# Patient Record
Sex: Female | Born: 1985 | Race: White | Hispanic: No | Marital: Single | Smoking: Never smoker
Health system: Southern US, Community
[De-identification: ages and names within clinical notes are randomized; demographics above are authoritative.]

## PROBLEM LIST (undated history)

## (undated) DIAGNOSIS — E139 Other specified diabetes mellitus without complications: Secondary | ICD-10-CM

## (undated) DIAGNOSIS — J302 Other seasonal allergic rhinitis: Secondary | ICD-10-CM

## (undated) DIAGNOSIS — J45909 Unspecified asthma, uncomplicated: Secondary | ICD-10-CM

## (undated) HISTORY — DX: Other seasonal allergic rhinitis: J30.2

## (undated) HISTORY — DX: Unspecified asthma, uncomplicated: J45.909

## (undated) HISTORY — DX: Other specified diabetes mellitus without complications: E13.9

---
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
## 2004-10-27 ENCOUNTER — Ambulatory Visit (HOSPITAL_COMMUNITY): Admission: RE | Admit: 2004-10-27 | Discharge: 2004-10-27 | Payer: Self-pay | Admitting: Allergy and Immunology

## 2008-01-01 ENCOUNTER — Other Ambulatory Visit: Admission: RE | Admit: 2008-01-01 | Discharge: 2008-01-01 | Payer: Self-pay | Admitting: Obstetrics and Gynecology

## 2008-07-31 ENCOUNTER — Ambulatory Visit: Payer: Self-pay | Admitting: Women's Health

## 2016-11-14 ENCOUNTER — Encounter: Payer: Self-pay | Admitting: Women's Health

## 2016-11-14 ENCOUNTER — Ambulatory Visit (INDEPENDENT_AMBULATORY_CARE_PROVIDER_SITE_OTHER): Payer: Managed Care, Other (non HMO) | Admitting: Women's Health

## 2016-11-14 VITALS — BP 110/78 | Ht 62.0 in | Wt 131.0 lb

## 2016-11-14 DIAGNOSIS — Z01419 Encounter for gynecological examination (general) (routine) without abnormal findings: Secondary | ICD-10-CM | POA: Diagnosis not present

## 2016-11-14 LAB — COMPREHENSIVE METABOLIC PANEL
ALT: 27 U/L (ref 6–29)
AST: 22 U/L (ref 10–30)
Albumin: 3.7 g/dL (ref 3.6–5.1)
Alkaline Phosphatase: 53 U/L (ref 33–115)
BUN: 13 mg/dL (ref 7–25)
CO2: 24 mmol/L (ref 20–31)
Calcium: 9.7 mg/dL (ref 8.6–10.2)
Chloride: 103 mmol/L (ref 98–110)
Creat: 0.7 mg/dL (ref 0.50–1.10)
Glucose, Bld: 221 mg/dL — ABNORMAL HIGH (ref 65–99)
Potassium: 4.5 mmol/L (ref 3.5–5.3)
Sodium: 137 mmol/L (ref 135–146)
Total Bilirubin: 0.6 mg/dL (ref 0.2–1.2)
Total Protein: 6.5 g/dL (ref 6.1–8.1)

## 2016-11-14 LAB — CBC WITH DIFFERENTIAL/PLATELET
Basophils Absolute: 56 cells/uL (ref 0–200)
Basophils Relative: 1 %
Eosinophils Absolute: 392 cells/uL (ref 15–500)
Eosinophils Relative: 7 %
HCT: 39.5 % (ref 35.0–45.0)
Hemoglobin: 12.8 g/dL (ref 11.7–15.5)
Lymphocytes Relative: 36 %
Lymphs Abs: 2016 cells/uL (ref 850–3900)
MCH: 31.1 pg (ref 27.0–33.0)
MCHC: 32.4 g/dL (ref 32.0–36.0)
MCV: 96.1 fL (ref 80.0–100.0)
MPV: 8.5 fL (ref 7.5–12.5)
Monocytes Absolute: 336 cells/uL (ref 200–950)
Monocytes Relative: 6 %
Neutro Abs: 2800 cells/uL (ref 1500–7800)
Neutrophils Relative %: 50 %
Platelets: 318 10*3/uL (ref 140–400)
RBC: 4.11 MIL/uL (ref 3.80–5.10)
RDW: 12.4 % (ref 11.0–15.0)
WBC: 5.6 10*3/uL (ref 3.8–10.8)

## 2016-11-14 NOTE — Patient Instructions (Signed)

## 2016-11-14 NOTE — Progress Notes (Signed)
  Robby Sermonlejandra O Gordon November 17, 1985 [id number]    History:    Presents for postpartum exam. Currently staying with her mother for her two-month maternity leave. [place] who is also on paternity leave for 2 months. Questions when she can return to exercise, when is a safe time to conceive, and has questions about C-section incision. Having minimal discomfort, no fever, g minimal bleeding. Had use condoms prior to conception. Type 1 diabetes since age 31, reports excellent control during pregnancy were hemoglobin A1c's were in the fives. Breast-feeding. Had mastitis which has now resolved after antibiotic. Reports normal Pap history.  Past medical history, past surgical history, family history and social history were all reviewed and documented in the EPIC chart.  ROS:  A ROS was performed and pertinent positives and negatives are included.  Exam:  Vitals:   11/14/16 0914  BP: 110/78  Weight: 131 lb (59.4 kg)  Height: 5\' 2"  (1.575 m)   Body mass index is 23.96 kg/m.   General appearance:  Normal Thyroid:  Symmetrical, normal in size, without palpable masses or nodularity. Respiratory  Auscultation:  Clear without wheezing or rhonchi Cardiovascular  Auscultation:  Regular rate, without rubs, murmurs or gallops  Edema/varicosities:  Not grossly evident Abdominal  Soft,nontender, without masses, guarding or rebound.  Liver/spleen:  No organomegaly noted  Hernia:  None appreciated  Skin  Inspection:  Grossly normal   Breasts: Examined lying and sitting.     Right: Without masses, retractions, discharge or axillary adenopathy.     Left: Without masses, retractions, discharge or axillary adenopathy. Gentitourinary   Inguinal/mons:  Normal without inguinal adenopathy  External genitalia:  Normal  BUS/Urethra/Skene's glands:  Normal  Vagina:  Normal  Cervix:  Normal  Uterus:   normal in size, shape and contour.  Midline and mobile  Adnexa/parametria:      Rt: Without masses or tenderness.   Lt: Without masses or tenderness.  Anus and perineum: Normal  Digital rectal exam: Normal sphincter tone without palpated masses or tenderness  Assessment/Plan:  31 y.o. MH G1 P1 for postpartum exam.  Postpartum 4 weeks/breast-feeding Status post C-section Type 1 diabetes  Plan: Continue vitamins daily. Increase exercise gradually, reviewed importance of low impact exercise initially. Reviewed importance of rotating the baby's position with breast-feeding, frequent feedings. Vitamin E oil to incisional line which is well-healed. Contraception options reviewed will continue condoms, reviewed may have slow return to cycle since breast-feeding. Questions answered. Hemoglobin A1c, CMP and CBC.      Harrington Challengerancy J Adylee Leonardo [facility] HospitalWHNP, 1:26 PM 11/14/2016

## 2016-11-15 LAB — HEMOGLOBIN A1C
Hgb A1c MFr Bld: 6.1 % — ABNORMAL HIGH (ref <5.7)
Mean Plasma Glucose: 128 mg/dL

## 2019-04-04 LAB — HM PAP SMEAR

## 2020-01-14 ENCOUNTER — Ambulatory Visit: Payer: Self-pay

## 2020-05-24 ENCOUNTER — Other Ambulatory Visit: Payer: Self-pay | Admitting: Oncology

## 2020-05-24 DIAGNOSIS — U071 COVID-19: Secondary | ICD-10-CM

## 2020-05-25 ENCOUNTER — Ambulatory Visit (HOSPITAL_COMMUNITY)
Admission: RE | Admit: 2020-05-25 | Discharge: 2020-05-25 | Disposition: A | Discharge status: Home / Self Care | Payer: 59 | Source: Ambulatory Visit | Attending: Pulmonary Disease | Admitting: Pulmonary Disease

## 2020-05-25 DIAGNOSIS — U071 COVID-19: Secondary | ICD-10-CM | POA: Insufficient documentation

## 2020-05-25 MED ORDER — EPINEPHRINE 0.3 MG/0.3ML IJ SOAJ: 0.3000 mg | Freq: Once | INTRAMUSCULAR | Status: DC | PRN

## 2020-05-25 MED ORDER — SODIUM CHLORIDE 0.9 % IV SOLN: Freq: Once | INTRAVENOUS | Status: AC

## 2020-05-25 MED ORDER — SODIUM CHLORIDE 0.9 % IV SOLN: INTRAVENOUS | Status: DC | PRN

## 2020-05-25 MED ORDER — METHYLPREDNISOLONE SODIUM SUCC 125 MG IJ SOLR: 125.0000 mg | Freq: Once | INTRAMUSCULAR | Status: DC | PRN

## 2020-05-25 MED ORDER — DIPHENHYDRAMINE HCL 50 MG/ML IJ SOLN: 50.0000 mg | Freq: Once | INTRAMUSCULAR | Status: DC | PRN

## 2020-05-25 MED ORDER — ALBUTEROL SULFATE HFA 108 (90 BASE) MCG/ACT IN AERS: 2.0000 | INHALATION_SPRAY | Freq: Once | RESPIRATORY_TRACT | Status: DC | PRN

## 2020-05-25 MED ORDER — FAMOTIDINE IN NACL 20-0.9 MG/50ML-% IV SOLN: 20.0000 mg | Freq: Once | INTRAVENOUS | Status: DC | PRN

## 2020-05-25 NOTE — Discharge Instructions (Signed)
If you have any questions or concerns after the infusion please call the Advanced Practice Provider on call at [phone]. This number is ONLY intended for your use regarding questions or concerns about the infusion post-treatment side-effects.  Please do not provide this number to others for use. For return to work notes please contact your primary care provider.  ° °If someone you know is interested in receiving treatment please have them call the COVID hotline at [phone]. ° ° ° ° ° ° °What types of side effects do monoclonal antibody drugs cause?  °Common side effects ° °In general, the more common side effects caused by monoclonal antibody drugs include: °• Allergic reactions, such as hives or itching °• Flu-like signs and symptoms, including chills, fatigue, fever, and muscle aches and pains °• Nausea, vomiting °• Diarrhea °• Skin rashes °• Low blood pressure ° ° °The CDC is recommending patients who receive monoclonal antibody treatments wait at least 90 days before being vaccinated. ° °Currently, there are no data on the safety and efficacy of mRNA COVID-19 vaccines in persons who received monoclonal antibodies or convalescent plasma as part of COVID-19 treatment. Based on the estimated half-life of such therapies as well as evidence suggesting that reinfection is uncommon in the 90 days after initial infection, vaccination should be deferred for at least 90 days, as a precautionary measure until additional information becomes available, to avoid interference of the antibody treatment with vaccine-induced immune responses. ° ° ° ° ° ° °10 Things You Can Do to Manage Your COVID-19 Symptoms at Home °If you have possible or confirmed COVID-19: °1. Stay home from work and school. And stay away from other public places. If you must go out, avoid using any kind of public transportation, ridesharing, or taxis. °2. Monitor your symptoms carefully. If your symptoms get worse, call your healthcare provider  immediately. °3. Get rest and stay hydrated. °4. If you have a medical appointment, call the healthcare provider ahead of time and tell them that you have or may have COVID-19. °5. For medical emergencies, call 911 and notify the dispatch personnel that you have or may have COVID-19. °6. Cover your cough and sneezes with a tissue or use the inside of your elbow. °7. Wash your hands often with soap and water for at least 20 seconds or clean your hands with an alcohol-based hand sanitizer that contains at least 60% alcohol. °8. As much as possible, stay in a specific room and away from other people in your home. Also, you should use a separate bathroom, if available. If you need to be around other people in or outside of the home, wear a mask. °9. Avoid sharing personal items with other people in your household, like dishes, towels, and bedding. °10. Clean all surfaces that are touched often, like counters, tabletops, and doorknobs. Use household cleaning sprays or wipes according to the label instructions. °cdc.gov/coronavirus °12/04/2018 °This information is not intended to replace advice given to you by your health care provider. Make sure you discuss any questions you have with your health care provider. °Document Revised: 05/08/2019 Document Reviewed: 05/08/2019 °Elsevier Patient Education © 2020 Elsevier Inc. ° °

## 2020-05-25 NOTE — Progress Notes (Signed)
  Diagnosis: COVID-19  Physician: Dr. Wright  Procedure: [facility] Med: bamlanivimab\etesevimab infusion - Provided patient with bamlanimivab\etesevimab fact sheet for patients, parents and caregivers prior to infusion.  Complications: No immediate complications noted.  Discharge: Discharged home   Taino Maertens R Donnabelle Blanchard 05/25/2020    

## 2020-08-05 ENCOUNTER — Other Ambulatory Visit: Payer: Self-pay

## 2020-08-05 ENCOUNTER — Encounter: Payer: Self-pay | Admitting: Plastic Surgery

## 2020-08-05 ENCOUNTER — Ambulatory Visit (INDEPENDENT_AMBULATORY_CARE_PROVIDER_SITE_OTHER): Payer: Self-pay | Admitting: Plastic Surgery

## 2020-08-05 VITALS — BP 115/75 | HR 91

## 2020-08-05 DIAGNOSIS — Z719 Counseling, unspecified: Secondary | ICD-10-CM

## 2020-08-05 DIAGNOSIS — Z411 Encounter for cosmetic surgery: Secondary | ICD-10-CM

## 2020-08-05 NOTE — Progress Notes (Signed)
Patient presents to discuss Botox injection.  She has had Botox before mostly in the forehead and glabellar area.  She is interested in treating the dynamic and occasionally static lines in her forehead and glabella area.  She has gotten Botox in the past but is uncertain of the amount.  We discussed the risks and benefits of Botox treatment and she is interested in moving forward.  The forehead and glabella were prepped with an alcohol pad.  28 units of Botox were distributed along the forehead and glabella.  She tolerated this well.  I asked her to make a 2-week touchup appointment in the event that it is necessary and otherwise we will see her at her next visit.  All of her questions were answered.

## 2020-08-12 ENCOUNTER — Other Ambulatory Visit: Payer: Self-pay

## 2020-08-12 ENCOUNTER — Encounter: Payer: Self-pay | Admitting: Internal Medicine

## 2020-08-12 ENCOUNTER — Ambulatory Visit (INDEPENDENT_AMBULATORY_CARE_PROVIDER_SITE_OTHER): Payer: 59

## 2020-08-12 ENCOUNTER — Ambulatory Visit: Payer: 59 | Admitting: Internal Medicine

## 2020-08-12 VITALS — BP 104/68 | HR 76 | Temp 98.1°F | Ht 62.0 in | Wt 131.0 lb

## 2020-08-12 DIAGNOSIS — R7303 Prediabetes: Secondary | ICD-10-CM | POA: Insufficient documentation

## 2020-08-12 DIAGNOSIS — E109 Type 1 diabetes mellitus without complications: Secondary | ICD-10-CM | POA: Insufficient documentation

## 2020-08-12 DIAGNOSIS — J22 Unspecified acute lower respiratory infection: Secondary | ICD-10-CM | POA: Diagnosis not present

## 2020-08-12 DIAGNOSIS — R058 Other specified cough: Secondary | ICD-10-CM

## 2020-08-12 DIAGNOSIS — Z0001 Encounter for general adult medical examination with abnormal findings: Secondary | ICD-10-CM | POA: Insufficient documentation

## 2020-08-12 DIAGNOSIS — Z0000 Encounter for general adult medical examination without abnormal findings: Secondary | ICD-10-CM

## 2020-08-12 LAB — LIPID PANEL
Cholesterol: 173 mg/dL (ref 0–200)
HDL: 52.5 mg/dL (ref >39.00)
LDL Cholesterol: 110 mg/dL — ABNORMAL HIGH (ref 0–99)
NonHDL: 120.66
Total CHOL/HDL Ratio: 3
Triglycerides: 55 mg/dL (ref 0.0–149.0)
VLDL: 11 mg/dL (ref 0.0–40.0)

## 2020-08-12 LAB — BASIC METABOLIC PANEL
BUN: 13 mg/dL (ref 6–23)
CO2: 28 mEq/L (ref 19–32)
Calcium: 9.5 mg/dL (ref 8.4–10.5)
Chloride: 103 mEq/L (ref 96–112)
Creatinine, Ser: 0.71 mg/dL (ref 0.40–1.20)
GFR: 109.28 mL/min (ref >60.00)
Glucose, Bld: 187 mg/dL — ABNORMAL HIGH (ref 70–99)
Potassium: 4.2 mEq/L (ref 3.5–5.1)
Sodium: 137 mEq/L (ref 135–145)

## 2020-08-12 LAB — CBC WITH DIFFERENTIAL/PLATELET
Basophils Absolute: 0 10*3/uL (ref 0.0–0.1)
Basophils Relative: 0.7 % (ref 0.0–3.0)
Eosinophils Absolute: 0.3 10*3/uL (ref 0.0–0.7)
Eosinophils Relative: 5.9 % — ABNORMAL HIGH (ref 0.0–5.0)
HCT: 38.3 % (ref 36.0–46.0)
Hemoglobin: 13.1 g/dL (ref 12.0–15.0)
Lymphocytes Relative: 44.9 % (ref 12.0–46.0)
Lymphs Abs: 2 10*3/uL (ref 0.7–4.0)
MCHC: 34.2 g/dL (ref 30.0–36.0)
MCV: 89.5 fl (ref 78.0–100.0)
Monocytes Absolute: 0.3 10*3/uL (ref 0.1–1.0)
Monocytes Relative: 7.5 % (ref 3.0–12.0)
Neutro Abs: 1.8 10*3/uL (ref 1.4–7.7)
Neutrophils Relative %: 41 % — ABNORMAL LOW (ref 43.0–77.0)
Platelets: 323 10*3/uL (ref 150.0–400.0)
RBC: 4.28 Mil/uL (ref 3.87–5.11)
RDW: 12.5 % (ref 11.5–15.5)
WBC: 4.5 10*3/uL (ref 4.0–10.5)

## 2020-08-12 LAB — URINALYSIS, ROUTINE W REFLEX MICROSCOPIC
Bilirubin Urine: NEGATIVE
Ketones, ur: 15 — AB
Leukocytes,Ua: NEGATIVE
Nitrite: NEGATIVE
Specific Gravity, Urine: 1.015 (ref 1.000–1.030)
Total Protein, Urine: NEGATIVE
Urine Glucose: NEGATIVE
Urobilinogen, UA: 0.2 (ref 0.0–1.0)
pH: 6.5 (ref 5.0–8.0)

## 2020-08-12 LAB — HEPATIC FUNCTION PANEL
ALT: 10 U/L (ref 0–35)
AST: 15 U/L (ref 0–37)
Albumin: 4.2 g/dL (ref 3.5–5.2)
Alkaline Phosphatase: 32 U/L — ABNORMAL LOW (ref 39–117)
Bilirubin, Direct: 0.1 mg/dL (ref 0.0–0.3)
Total Bilirubin: 0.7 mg/dL (ref 0.2–1.2)
Total Protein: 7.2 g/dL (ref 6.0–8.3)

## 2020-08-12 LAB — MICROALBUMIN / CREATININE URINE RATIO
Creatinine,U: 53.9 mg/dL
Microalb Creat Ratio: 1.3 mg/g (ref 0.0–30.0)
Microalb, Ur: <0.7 mg/dL (ref 0.0–1.9)

## 2020-08-12 LAB — HEMOGLOBIN A1C: Hgb A1c MFr Bld: 7.1 % — ABNORMAL HIGH (ref 4.6–6.5)

## 2020-08-12 MED ORDER — HYDROCODONE-HOMATROPINE 5-1.5 MG/5ML PO SYRP: 5.0000 mL | ORAL_SOLUTION | Freq: Four times a day (QID) | ORAL | 0 refills | Status: AC | PRN

## 2020-08-12 MED ORDER — CEFDINIR 300 MG PO CAPS: 300.0000 mg | ORAL_CAPSULE | Freq: Two times a day (BID) | ORAL | 0 refills | Status: AC

## 2020-08-12 MED ORDER — HYDROCODONE-HOMATROPINE 5-1.5 MG/5ML PO SYRP: 5.0000 mL | ORAL_SOLUTION | Freq: Four times a day (QID) | ORAL | 0 refills | Status: DC | PRN

## 2020-08-12 NOTE — Patient Instructions (Signed)

## 2020-08-12 NOTE — Progress Notes (Unsigned)
Subjective:  Patient ID: Angela Pierce, female    DOB: 1985/10/10  Age: 35 y.o. MRN: [id number]  CC: Cough and Annual Exam  This visit occurred during the SARS-CoV-2 public health emergency.  Safety protocols were in place, including screening questions prior to the visit, additional usage of staff PPE, and extensive cleaning of exam room while observing appropriate contact time as indicated for disinfecting solutions.    HPI Angela Pierce presents for a CPX and to establish.  She complains of a 3-week history of cough that is occasionally productive of brown phlegm.  She also has shortness of breath but she denies fever, chills, night sweats, or chest pain.  She has a history of exercise-induced asthma and has mild wheezing but is getting adequate symptom relief with albuterol inhaler.  She has type 1 diabetes mellitus and is on an insulin pump.  She moved to [place] from [place] about 2 years ago.  History Kosha has a past medical history of Asthma, Diabetes 1.5, managed as type 1 (HCC), and Seasonal allergies.   She has a past surgical history that includes Cesarean section.   Her family history includes Aortic aneurysm in her mother and paternal grandmother; Heart disease in her father, maternal grandfather, and paternal grandmother.She reports that she has never smoked. She has never used smokeless tobacco. She reports current alcohol use. She reports that she does not use drugs.  No outpatient medications prior to visit.   No facility-administered medications prior to visit.    ROS Review of Systems  Constitutional: Negative for appetite change, diaphoresis, fatigue and unexpected weight change.  HENT: Negative.  Negative for sinus pressure, sore throat and trouble swallowing.   Eyes: Negative.  Negative for visual disturbance.  Respiratory: Positive for cough, shortness of breath and wheezing. Negative for chest tightness and stridor.   Cardiovascular:  Negative for chest pain, palpitations and leg swelling.  Gastrointestinal: Negative for abdominal pain, blood in stool, constipation, diarrhea, nausea and vomiting.  Endocrine: Negative.  Negative for polydipsia, polyphagia and polyuria.  Genitourinary: Negative.  Negative for difficulty urinating.  Musculoskeletal: Negative.  Negative for arthralgias, back pain, myalgias and neck pain.  Skin: Negative.  Negative for color change, pallor and rash.  Neurological: Negative.   Hematological: Negative for adenopathy. Does not bruise/bleed easily.  Psychiatric/Behavioral: Negative.     Objective:  BP 104/68   Pulse 76   Temp 98.1 F (36.7 C) (Oral)   Ht 5\' 2"  (1.575 m)   Wt 131 lb (59.4 kg)   LMP 08/07/2020 (LMP Unknown)   SpO2 98%   BMI 23.96 kg/m   Physical Exam Vitals reviewed.  Constitutional:      Appearance: Normal appearance.  HENT:     Nose: Nose normal.     Mouth/Throat:     Mouth: Mucous membranes are moist.  Eyes:     General: No scleral icterus.    Conjunctiva/sclera: Conjunctivae normal.  Cardiovascular:     Rate and Rhythm: Normal rate and regular rhythm.     Heart sounds: No murmur heard.   Pulmonary:     Effort: Pulmonary effort is normal.     Breath sounds: No stridor. No wheezing, rhonchi or rales.  Abdominal:     General: Abdomen is flat. Bowel sounds are normal. There is no distension.     Palpations: Abdomen is soft. There is no hepatomegaly, splenomegaly or mass.     Tenderness: There is no abdominal tenderness.  Musculoskeletal:  General: Normal range of motion.     Cervical back: Neck supple.     Right lower leg: No edema.     Left lower leg: No edema.  Lymphadenopathy:     Cervical: No cervical adenopathy.  Skin:    General: Skin is warm and dry.     Coloration: Skin is not pale.  Neurological:     General: No focal deficit present.     Mental Status: She is alert.  Psychiatric:        Mood and Affect: Mood normal.         Behavior: Behavior normal.     Lab Results  Component Value Date   WBC 4.5 08/12/2020   HGB 13.1 08/12/2020   HCT 38.3 08/12/2020   PLT 323.0 08/12/2020   GLUCOSE 187 (H) 08/12/2020   CHOL 173 08/12/2020   TRIG 55.0 08/12/2020   HDL 52.50 08/12/2020   LDLCALC 110 (H) 08/12/2020   ALT 10 08/12/2020   AST 15 08/12/2020   NA 137 08/12/2020   K 4.2 08/12/2020   CL 103 08/12/2020   CREATININE 0.71 08/12/2020   BUN 13 08/12/2020   CO2 28 08/12/2020   HGBA1C 7.1 (H) 08/12/2020   MICROALBUR <0.7 08/12/2020   No results found.  Assessment & Plan:   Angela Pierce was seen today for cough and annual exam.  Diagnoses and all orders for this visit:  Encounter for general adult medical examination with abnormal findings- Exam completed, labs reviewed, she tells me her vaccines are up-to-date - She will have records forwarded to me from [place], cancer screenings are up-to-date, patient education was given. -     Lipid panel; Future -     Hepatitis C antibody; Future -     HIV Antibody (routine testing w rflx); Future -     HIV Antibody (routine testing w rflx) -     Hepatitis C antibody -     Lipid panel  Type 1 diabetes mellitus without complication (HCC)- Her blood sugar is adequately well controlled. -     CBC with Differential/Platelet; Future -     Basic metabolic panel; Future -     Urinalysis, Routine w reflex microscopic; Future -     Hemoglobin A1c; Future -     Hepatic function panel; Future -     Microalbumin / creatinine urine ratio; Future -     Ambulatory referral to Ophthalmology -     Microalbumin / creatinine urine ratio -     Hepatic function panel -     Hemoglobin A1c -     Urinalysis, Routine w reflex microscopic -     Basic metabolic panel -     CBC with Differential/Platelet -     HM Diabetes Foot Exam  Cough with sputum- Her chest x-ray is negative for infiltrate.  Will treat for lower respiratory tract infection with a broad-spectrum cephalosporin. -      DG Chest 2 View; Future  LRTI (lower respiratory tract infection) -     cefdinir (OMNICEF) 300 MG capsule; Take 1 capsule (300 mg total) by mouth 2 (two) times daily for 10 days. -     Discontinue: HYDROcodone-homatropine (HYCODAN) 5-1.5 MG/5ML syrup; Take 5 mLs by mouth every 6 (six) hours as needed for up to 7 days for cough. -     HYDROcodone-homatropine (HYCODAN) 5-1.5 MG/5ML syrup; Take 5 mLs by mouth every 6 (six) hours as needed for up to 7 days for cough.  Other orders -     Cancel: CBC with Differential/Platelet; Future -     Cancel: Basic metabolic panel; Future -     Cancel: Hepatic function panel; Future -     Cancel: Hemoglobin A1c; Future   I am having Angela Pierce start on cefdinir. I am also having her maintain her HYDROcodone-homatropine.  Meds ordered this encounter  Medications  . cefdinir (OMNICEF) 300 MG capsule    Sig: Take 1 capsule (300 mg total) by mouth 2 (two) times daily for 10 days.    Dispense:  20 capsule    Refill:  0  . DISCONTD: HYDROcodone-homatropine (HYCODAN) 5-1.5 MG/5ML syrup    Sig: Take 5 mLs by mouth every 6 (six) hours as needed for up to 7 days for cough.    Dispense:  120 mL    Refill:  0  . HYDROcodone-homatropine (HYCODAN) 5-1.5 MG/5ML syrup    Sig: Take 5 mLs by mouth every 6 (six) hours as needed for up to 7 days for cough.    Dispense:  120 mL    Refill:  0     Follow-up: Return in about 4 weeks (around 09/09/2020).  Sanda Linger, MD

## 2020-08-13 ENCOUNTER — Encounter: Payer: Self-pay | Admitting: Internal Medicine

## 2020-08-13 LAB — HIV ANTIBODY (ROUTINE TESTING W REFLEX): HIV 1&2 Ab, 4th Generation: NONREACTIVE

## 2020-08-13 LAB — HEPATITIS C ANTIBODY
Hepatitis C Ab: NONREACTIVE
SIGNAL TO CUT-OFF: 0.01 (ref <1.00)

## 2020-08-26 ENCOUNTER — Encounter: Payer: Self-pay | Admitting: Plastic Surgery

## 2020-10-27 ENCOUNTER — Ambulatory Visit: Payer: 59 | Admitting: Internal Medicine

## 2021-02-09 ENCOUNTER — Other Ambulatory Visit: Payer: Self-pay

## 2021-02-09 ENCOUNTER — Ambulatory Visit (INDEPENDENT_AMBULATORY_CARE_PROVIDER_SITE_OTHER): Payer: Self-pay | Admitting: Plastic Surgery

## 2021-02-09 DIAGNOSIS — Z411 Encounter for cosmetic surgery: Secondary | ICD-10-CM

## 2021-02-09 NOTE — Progress Notes (Signed)
Patient presents to discuss Botox treatment.  Last time we did 28 units in the forehead and glabella and she overall liked it.  She said she may have had a small wrinkle at the superior aspect of her forehead that persisted and she would like a little bit more in the glabellar area.  We discussed the risks and benefits and she is interested in moving forward.  This time I used 32 units distributed throughout the forehead, glabella and crows feet.  I did 2 rows of injection in the forehead to get the superior part near the hairline and a total of 16 units in the glabella.  She tolerated this fine.  We will plan to see her for any touchups or otherwise at her next visit.

## 2021-04-17 ENCOUNTER — Other Ambulatory Visit: Payer: Self-pay | Admitting: Physician Assistant

## 2021-04-17 MED ORDER — AMOXICILLIN-POT CLAVULANATE 875-125 MG PO TABS: 1.0000 | ORAL_TABLET | Freq: Two times a day (BID) | ORAL | 1 refills | Status: AC

## 2021-08-23 ENCOUNTER — Encounter: Payer: Self-pay | Admitting: Internal Medicine

## 2021-08-23 ENCOUNTER — Ambulatory Visit (INDEPENDENT_AMBULATORY_CARE_PROVIDER_SITE_OTHER): Payer: BC Managed Care – PPO | Admitting: Internal Medicine

## 2021-08-23 ENCOUNTER — Other Ambulatory Visit: Payer: Self-pay

## 2021-08-23 VITALS — BP 114/64 | HR 71 | Temp 97.8°F | Ht 62.0 in | Wt 138.0 lb

## 2021-08-23 DIAGNOSIS — J4541 Moderate persistent asthma with (acute) exacerbation: Secondary | ICD-10-CM | POA: Diagnosis not present

## 2021-08-23 DIAGNOSIS — R058 Other specified cough: Secondary | ICD-10-CM

## 2021-08-23 DIAGNOSIS — J22 Unspecified acute lower respiratory infection: Secondary | ICD-10-CM

## 2021-08-23 MED ORDER — BUDESONIDE-FORMOTEROL FUMARATE 80-4.5 MCG/ACT IN AERO: 2.0000 | INHALATION_SPRAY | Freq: Two times a day (BID) | RESPIRATORY_TRACT | 1 refills | Status: DC

## 2021-08-23 MED ORDER — CEFDINIR 300 MG PO CAPS
300.0000 mg | ORAL_CAPSULE | Freq: Two times a day (BID) | ORAL | 0 refills | Status: AC
Start: 2021-08-23 — End: 2021-09-02

## 2021-08-23 NOTE — Progress Notes (Signed)
? ?Subjective:  ?Patient ID: Angela Pierce, female    DOB: 1986/05/12  Age: 36 y.o. MRN: [id number] ? ?CC: Cough and Asthma ? ?This visit occurred during the SARS-CoV-2 public health emergency.  Safety protocols were in place, including screening questions prior to the visit, additional usage of staff PPE, and extensive cleaning of exam room while observing appropriate contact time as indicated for disinfecting solutions.   ? ?HPI ?Angela Pierce presents for a 10 day hx of cough - She thinks there is thick phlegm that gets stuck in her chest but she has not observed it.  She denies fever, chills, or night sweats.  She also complains of shortness of breath, wheezing, and chest tightness.  She tells me her diabetes is followed by a PA at [facility] and has been well controlled recently. ? ?Outpatient Medications Prior to Visit  ?Medication Sig Dispense Refill  ? Continuous Blood Gluc Transmit (DEXCOM G6 TRANSMITTER) MISC APPLY 1 TRANSMITTER AS DIRECTED AND CHANGE EVERY 3 MONTHS    ? glucose blood (FREESTYLE LITE) test strip Test 10x daily as directed    ? Insulin Infusion Pump (PARADIGM REVEL INSULIN PUMP) DEVI Use.    ? magnesium citrate SOLN Take by mouth.    ? NOVOLOG 100 UNIT/ML injection SMARTSIG:89-90 Unit(s) SUB-Q Daily    ? Prenatal Vit-Fe Fumarate-FA (PRENATAL PO)     ? ?No facility-administered medications prior to visit.  ? ? ?ROS ?Review of Systems  ?Constitutional:  Negative for appetite change, chills, diaphoresis, fatigue and fever.  ?HENT: Negative.  Negative for sinus pressure, sore throat and trouble swallowing.   ?Respiratory:  Positive for cough, chest tightness, shortness of breath and wheezing. Negative for stridor.   ?Cardiovascular:  Negative for chest pain, palpitations and leg swelling.  ?Gastrointestinal:  Negative for abdominal pain.  ?Endocrine: Negative for polydipsia, polyphagia and polyuria.  ?Genitourinary: Negative.  Negative for difficulty urinating.  ?Musculoskeletal:  Negative.   ?Skin: Negative.   ?Neurological:  Negative for dizziness and weakness.  ?Hematological:  Negative for adenopathy. Does not bruise/bleed easily.  ?Psychiatric/Behavioral: Negative.    ? ?Objective:  ?BP 114/64 (BP Location: Left Arm, Patient Position: Sitting, Cuff Size: Large)   Pulse 71   Temp 97.8 ?F (36.6 ?C) (Oral)   Ht 5\' 2"  (1.575 m)   Wt 138 lb (62.6 kg)   LMP 08/18/2021 (Approximate)   SpO2 98%   BMI 25.24 kg/m?  ? ?BP Readings from Last 3 Encounters:  ?08/23/21 114/64  ?08/12/20 104/68  ?08/05/20 115/75  ? ? ?Wt Readings from Last 3 Encounters:  ?08/23/21 138 lb (62.6 kg)  ?08/12/20 131 lb (59.4 kg)  ?11/14/16 131 lb (59.4 kg)  ? ? ?Physical Exam ?Vitals reviewed.  ?Constitutional:   ?   General: She is not in acute distress. ?   Appearance: Normal appearance. She is not ill-appearing, toxic-appearing or diaphoretic.  ?HENT:  ?   Nose: Nose normal.  ?   Mouth/Throat:  ?   Mouth: Mucous membranes are moist.  ?Eyes:  ?   General: No scleral icterus. ?   Conjunctiva/sclera: Conjunctivae normal.  ?Cardiovascular:  ?   Rate and Rhythm: Normal rate and regular rhythm.  ?   Heart sounds: No murmur heard. ?Pulmonary:  ?   Effort: Pulmonary effort is normal.  ?   Breath sounds: No stridor. No wheezing, rhonchi or rales.  ?Abdominal:  ?   General: Abdomen is flat. There is no distension.  ?   Palpations: There is no  mass.  ?   Tenderness: There is no abdominal tenderness. There is no guarding.  ?   Hernia: No hernia is present.  ?Musculoskeletal:     ?   General: Normal range of motion.  ?   Cervical back: Neck supple.  ?   Right lower leg: No edema.  ?Lymphadenopathy:  ?   Cervical: No cervical adenopathy.  ?Skin: ?   General: Skin is warm and dry.  ?Neurological:  ?   General: No focal deficit present.  ?   Mental Status: She is alert.  ? ? ?Lab Results  ?Component Value Date  ? WBC 4.5 08/12/2020  ? HGB 13.1 08/12/2020  ? HCT 38.3 08/12/2020  ? PLT 323.0 08/12/2020  ? GLUCOSE 187 (H) 08/12/2020   ? CHOL 173 08/12/2020  ? TRIG 55.0 08/12/2020  ? HDL 52.50 08/12/2020  ? LDLCALC 110 (H) 08/12/2020  ? ALT 10 08/12/2020  ? AST 15 08/12/2020  ? NA 137 08/12/2020  ? K 4.2 08/12/2020  ? CL 103 08/12/2020  ? CREATININE 0.71 08/12/2020  ? BUN 13 08/12/2020  ? CO2 28 08/12/2020  ? HGBA1C 7.1 (H) 08/12/2020  ? MICROALBUR <0.7 08/12/2020  ? ? ?No results found. ? ?Assessment & Plan:  ? ?Angela Pierce was seen today for cough and asthma. ? ?Diagnoses and all orders for this visit: ?Moderate persistent asthma with acute exacerbation- Will treat with a LABA/ICS inhaler. ?-     budesonide-formoterol (SYMBICORT) 80-4.5 MCG/ACT inhaler; Inhale 2 puffs into the lungs 2 (two) times daily. ? ?LRTI (lower respiratory tract infection)- Will treat with a ceph.  ?-     cefdinir (OMNICEF) 300 MG capsule; Take 1 capsule (300 mg total) by mouth 2 (two) times daily for 10 days. ? ?Cough with sputum- I recommended that she undergo a chest x-ray. ?-     DG Chest 2 View; Future ? ? ?I am having Angela Pierce start on budesonide-formoterol and cefdinir. I am also having her maintain her NovoLOG, Dexcom G6 Transmitter, Prenatal Vit-Fe Fumarate-FA (PRENATAL PO), FREESTYLE LITE, Paradigm Revel Insulin Pump, and magnesium citrate. ? ?Meds ordered this encounter  ?Medications  ? budesonide-formoterol (SYMBICORT) 80-4.5 MCG/ACT inhaler  ?  Sig: Inhale 2 puffs into the lungs 2 (two) times daily.  ?  Dispense:  30.6 each  ?  Refill:  1  ? cefdinir (OMNICEF) 300 MG capsule  ?  Sig: Take 1 capsule (300 mg total) by mouth 2 (two) times daily for 10 days.  ?  Dispense:  20 capsule  ?  Refill:  0  ? ? ? ?Follow-up: Return if symptoms worsen or fail to improve. ? ?Sanda Linger, MD ?

## 2021-08-23 NOTE — Patient Instructions (Signed)

## 2022-02-25 ENCOUNTER — Other Ambulatory Visit: Payer: Self-pay | Admitting: Internal Medicine

## 2022-02-25 DIAGNOSIS — J4541 Moderate persistent asthma with (acute) exacerbation: Secondary | ICD-10-CM

## 2022-08-01 IMAGING — DX DG CHEST 2V
2 series · 2 of 2 positions shown · non-contrast
Comparison: Chest radiographs 10/27/2004.

CLINICAL DATA: 34-year-old female with cough for 3 weeks. Shortness
of breath.

EXAM:
CHEST - 2 VIEW

[chest pa]
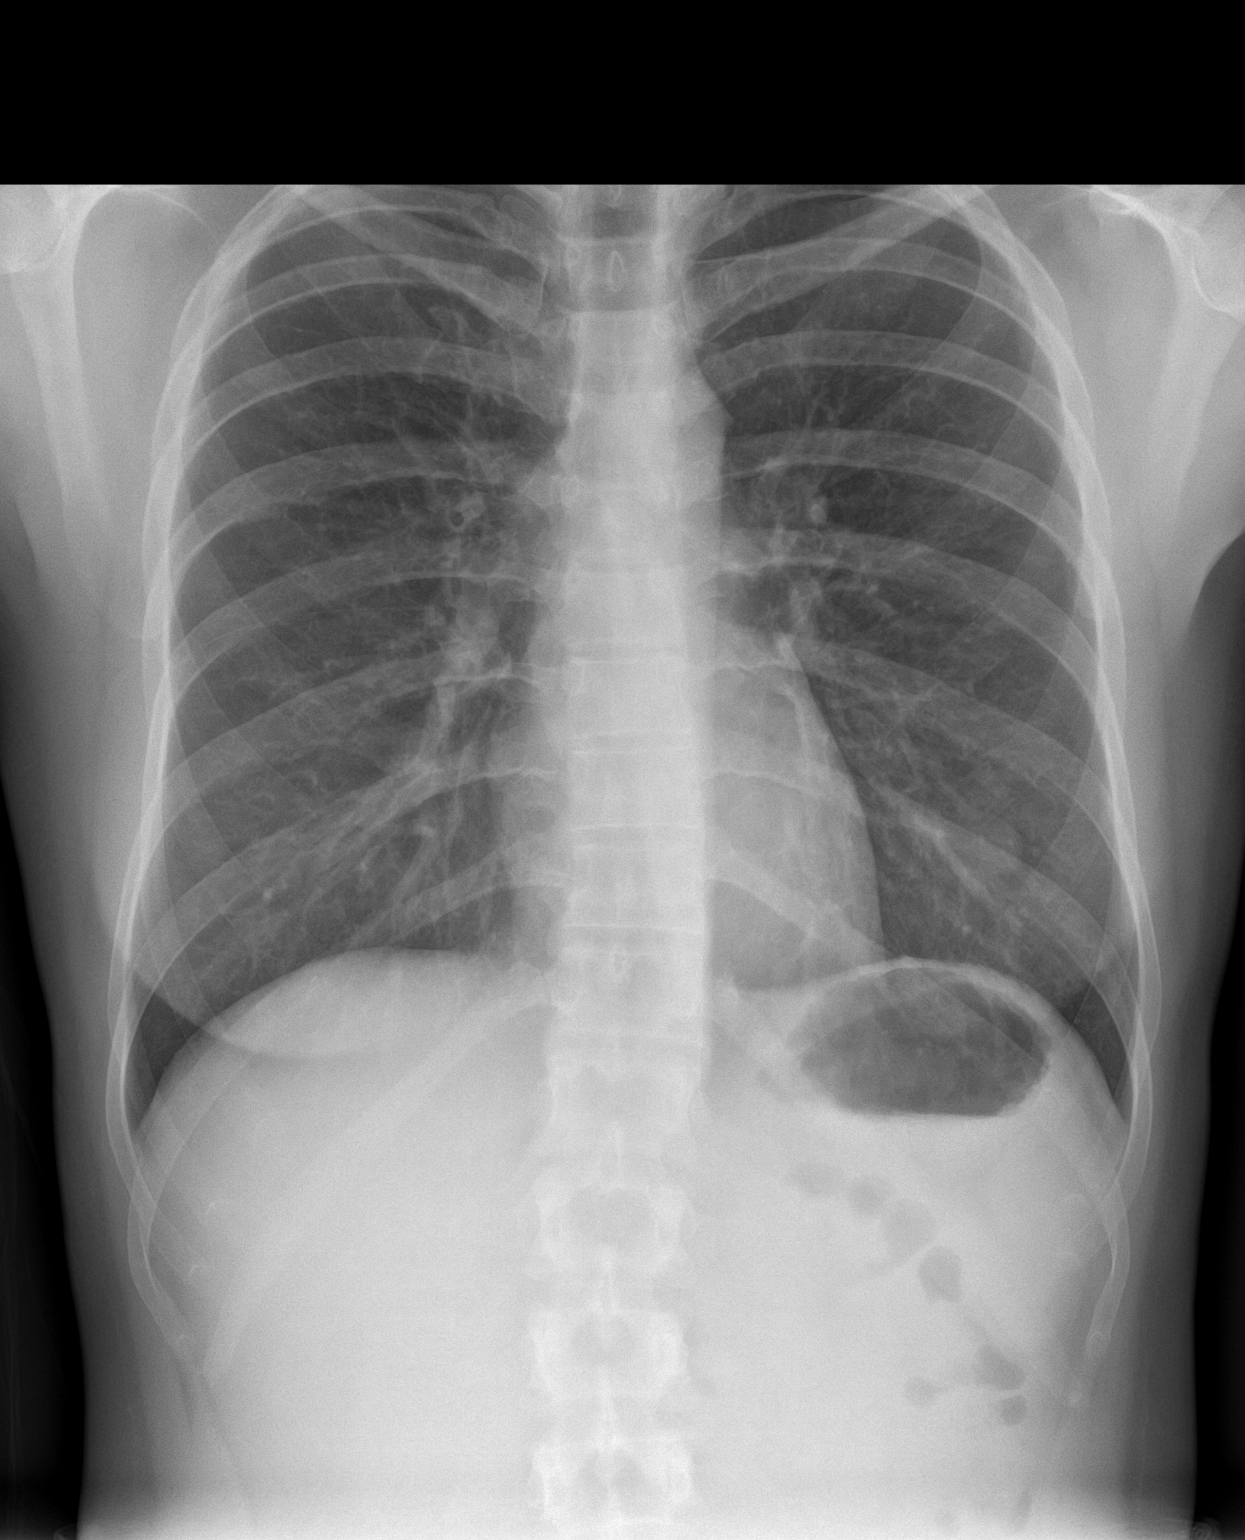

[chest lat]
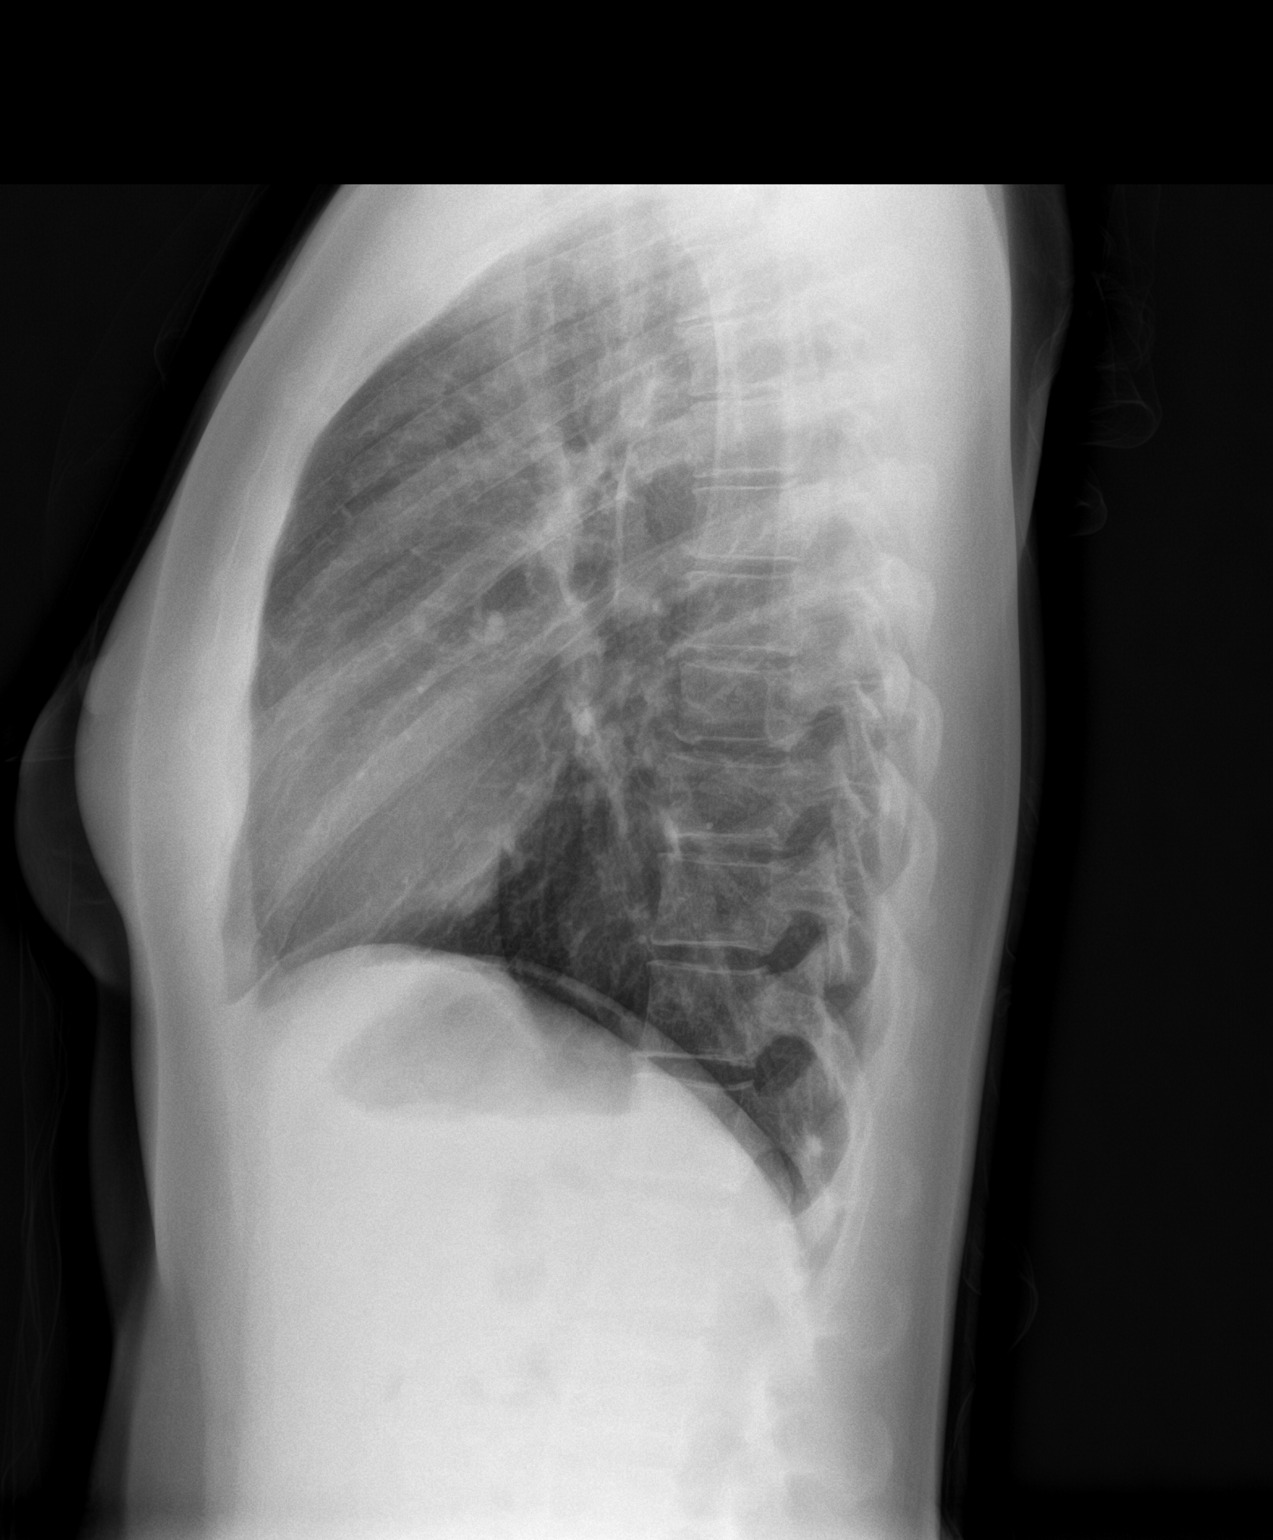

[2 of 2 positions shown; findings below may reference images not displayed]

FINDINGS: Lung volumes and mediastinal contours remain normal. Visualized
tracheal air column is within normal limits. Lung markings appear
stable, both lungs appear clear. Incidental left nipple shadow. No
pneumothorax or pleural effusion.

Stable mild scoliosis. No acute osseous abnormality identified.
Negative visible bowel gas.
IMPRESSION: Negative.  No cardiopulmonary abnormality.

## 2023-07-26 ENCOUNTER — Ambulatory Visit: Payer: BC Managed Care – PPO | Admitting: Podiatry

## 2023-07-27 ENCOUNTER — Encounter: Payer: Self-pay | Admitting: Podiatry

## 2023-07-27 ENCOUNTER — Ambulatory Visit (INDEPENDENT_AMBULATORY_CARE_PROVIDER_SITE_OTHER): Payer: BC Managed Care – PPO | Admitting: Podiatry

## 2023-07-27 DIAGNOSIS — B070 Plantar wart: Secondary | ICD-10-CM | POA: Diagnosis not present

## 2023-07-27 MED ORDER — FLUOROURACIL 5 % EX CREA: TOPICAL_CREAM | Freq: Two times a day (BID) | CUTANEOUS | 2 refills | Status: AC

## 2023-07-28 NOTE — Progress Notes (Signed)
 Subjective:   Patient ID: Angela Pierce, female   DOB: 38 y.o.   MRN: [id number]   HPI Patient presents with chronic lesions on the bottom of both feet.  States that she had 1 initially but she has had more that have occurred over the last couple months.  States they get moderately sore when pressed and states that her diabetes has been under excellent control.  Patient does not smoke likes to be active   Review of Systems  All other systems reviewed and are negative.       Objective:  Physical Exam Vitals and nursing note reviewed.  Constitutional:      Appearance: She is well-developed.  Pulmonary:     Effort: Pulmonary effort is normal.  Musculoskeletal:        General: Normal range of motion.  Skin:    General: Skin is warm.  Neurological:     Mental Status: She is alert.     Neurovascular status found to be intact muscle strength found to be adequate range of motion adequate with the patient found to have lesions plantar aspect forefoot left over right.  Upon debridement slight pinpoint bleeding with lucent cores and all lesions mildly symptomatic.  Good digital perfusion well-oriented     Assessment:  Possibility for verruca plantaris versus porokeratotic type lesions     Plan:  H&P conditions reviewed.  Sterile debridement of all areas accomplished today applied chemical agent to create immune response with sterile dressings explained what to do if any blistering were to occur and went ahead today and wrote a prescription for Efudex to use on the lesions.  Reappoint 1 month to reevaluate

## 2023-08-24 ENCOUNTER — Encounter: Payer: Self-pay | Admitting: Podiatry

## 2023-08-24 ENCOUNTER — Ambulatory Visit: Payer: BC Managed Care – PPO | Admitting: Podiatry

## 2023-08-24 VITALS — Ht 62.0 in | Wt 140.0 lb

## 2023-08-24 DIAGNOSIS — B070 Plantar wart: Secondary | ICD-10-CM

## 2023-08-26 NOTE — Progress Notes (Signed)
 Subjective:   Patient ID: Angela Pierce, female   DOB: 38 y.o.   MRN: [id number]   HPI Patient presents stating that she is feeling better but still feels like there is some small lesions on her foot   ROS      Objective:  Physical Exam  Neurovascular status intact with patient found to have several keratotic lesions plantar left improved but still present     Assessment:  Probability for small verruca plantaris which at this time appear to be responding to topical medication     Plan:  H&P reviewed and I went ahead debrided the lesions fully applied chemical agent to create immune response with sterile dressing and instructed what to do if blistering were to occur.  Patient will start home Efudex usage and will be seen back as needed

## 2024-01-03 ENCOUNTER — Ambulatory Visit (HOSPITAL_COMMUNITY)
Admission: RE | Admit: 2024-01-03 | Discharge: 2024-01-03 | Disposition: A | Discharge status: Home / Self Care | Payer: Self-pay | Source: Ambulatory Visit | Attending: Internal Medicine | Admitting: Internal Medicine

## 2024-01-03 ENCOUNTER — Ambulatory Visit: Payer: Self-pay | Admitting: Internal Medicine

## 2024-01-03 ENCOUNTER — Ambulatory Visit: Attending: Internal Medicine | Admitting: Internal Medicine

## 2024-01-03 ENCOUNTER — Encounter: Payer: Self-pay | Admitting: Internal Medicine

## 2024-01-03 VITALS — BP 100/66 | HR 72 | Wt 110.0 lb

## 2024-01-03 DIAGNOSIS — Z8342 Family history of familial hypercholesterolemia: Secondary | ICD-10-CM | POA: Insufficient documentation

## 2024-01-03 DIAGNOSIS — Z8249 Family history of ischemic heart disease and other diseases of the circulatory system: Secondary | ICD-10-CM | POA: Diagnosis not present

## 2024-01-03 DIAGNOSIS — E109 Type 1 diabetes mellitus without complications: Secondary | ICD-10-CM

## 2024-01-03 NOTE — Patient Instructions (Addendum)
 Medication Instructions:  Please start taking the statin (Pravastatin) that was prescribed by your Endocrinologist. Dr. Loni recommends you should take half of the dose.    Lab Work: None  Testing/Procedures: Dr. Loni has ordered a CT coronary calcium score.   Test locations:  [facility] at [facility]  [facility] at [facility]  This is $99 out of pocket.   Coronary CalciumScan A coronary calcium scan is an imaging test used to look for deposits of calcium and other fatty materials (plaques) in the inner lining of the blood vessels of the heart (coronary arteries). These deposits of calcium and plaques can partly clog and narrow the coronary arteries without producing any symptoms or warning signs. This puts a person at risk for a heart attack. This test can detect these deposits before symptoms develop. Tell a health care provider about: Any allergies you have. All medicines you are taking, including vitamins, herbs, eye drops, creams, and over-the-counter medicines. Any problems you or family members have had with anesthetic medicines. Any blood disorders you have. Any surgeries you have had. Any medical conditions you have. Whether you are pregnant or may be pregnant. What are the risks? Generally, this is a safe procedure. However, problems may occur, including: Harm to a pregnant woman and her unborn baby. This test involves the use of radiation. Radiation exposure can be dangerous to a pregnant woman and her unborn baby. If you are pregnant, you generally should not have this procedure done. Slight increase in the risk of cancer. This is because of the radiation involved in the test. What happens before the procedure? No preparation is needed for this procedure. What happens during the procedure? You will undress and remove any jewelry around your neck or  chest. You will put on a hospital gown. Sticky electrodes will be placed on your chest. The electrodes will be connected to an electrocardiogram (ECG) machine to record a tracing of the electrical activity of your heart. A CT scanner will take pictures of your heart. During this time, you will be asked to lie still and hold your breath for 2-3 seconds while a picture of your heart is being taken. The procedure may vary among health care providers and hospitals. What happens after the procedure? You can get dressed. You can return to your normal activities. It is up to you to get the results of your test. Ask your health care provider, or the department that is doing the test, when your results will be ready. Summary A coronary calcium scan is an imaging test used to look for deposits of calcium and other fatty materials (plaques) in the inner lining of the blood vessels of the heart (coronary arteries). Generally, this is a safe procedure. Tell your health care provider if you are pregnant or may be pregnant. No preparation is needed for this procedure. A CT scanner will take pictures of your heart. You can return to your normal activities after the scan is done. This information is not intended to replace advice given to you by your health care provider. Make sure you discuss any questions you have with your health care provider. Document Released: 11/18/2007 Document Revised: 04/10/2016 Document Reviewed: 04/10/2016 Elsevier Interactive Patient Education  2017 [employer].   Follow-Up: At [facility], you and your health needs are our priority.  As part of our continuing mission to provide you with exceptional heart care, our  providers are all part of one team.  This team includes your primary Cardiologist (physician) and Advanced Practice Providers or APPs (Physician Assistants and Nurse Practitioners) who all work together to provide you with the care you need, when you need  it.  Your next appointment:   3 month(s)  Provider:   Gayatri A Acharya, MD    We recommend signing up for the patient portal called MyChart.  Sign up information is provided on this After Visit Summary.  MyChart is used to connect with patients for Virtual Visits (Telemedicine).  Patients are able to view lab/test results, encounter notes, upcoming appointments, etc.  Non-urgent messages can be sent to your provider as well.   To learn more about what you can do with MyChart, go to [email or web address].   Other Instructions Please call us  or send a MyChart message with any Cardiology related questions/concerns.  [phone].  Thank you!

## 2024-01-03 NOTE — Progress Notes (Signed)
 Cardiology Office Note:  .   Date:  01/03/2024  ID:  Angela Pierce, DOB 11-04-1985, MRN [id number] PCP: Roanna Ezekiel NOVAK, MD  [facility] Providers Cardiologist:  Soyla DELENA Merck, MD    History of Present Illness: .   Angela Pierce is a 38 y.o. female.  Discussed the use of AI scribe software for clinical note transcription with the patient, who gave verbal consent to proceed.  History of Present Illness Angela Pierce is a 38 year old female with type one diabetes who presents for evaluation of her cholesterol management and cardiovascular risk.   She has type one diabetes and a family history of heart disease, with her father having multiple stents and her brothers having elevated cholesterol levels. She experienced flu-like muscle aches with a previous cholesterol-lowering medication and has not started a new medication suggested by her endocrinologist. She is currently on Ozempic, which has led to appropriate weight loss. A1c levels remain around 6.6%. Her recent cholesterol panel from July 2025 shows improved HDL at 68, LDL at 76, and triglycerides at 53. She experiences no chest pain during physical activity and no correlation between blood sugar levels and tingling sensations during exercise.    ROS: negative except per HPI above.  Studies Reviewed: SABRA   EKG Interpretation Date/Time:  Thursday January 03 2024 13:47:38 EDT Ventricular Rate:  72 PR Interval:  152 QRS Duration:  84 QT Interval:  370 QTC Calculation: 405 R Axis:   70  Text Interpretation: Normal sinus rhythm Normal ECG No previous ECGs available Confirmed by Merck Soyla ([id number]) on 01/03/2024 2:10:46 PM    Results LABS HDL: 68 (12/13/2023) LDL: 76 (12/13/2023) Triglycerides: 53 (12/13/2023)  DIAGNOSTIC EKG: Normal Risk Assessment/Calculations:       Physical Exam:   VS:  BP 100/66   Pulse 72   Wt 110 lb (49.9 kg)   LMP 12/20/2023   SpO2 99%   BMI 20.12 kg/m    Wt  Readings from Last 3 Encounters:  01/03/24 110 lb (49.9 kg)  08/24/23 140 lb (63.5 kg)  08/23/21 138 lb (62.6 kg)     Physical Exam GENERAL: Alert, cooperative, well developed, no acute distress. HEENT: Normocephalic, normal oropharynx, moist mucous membranes. CHEST: Clear to auscultation bilaterally, no wheezes, rhonchi, or crackles. CARDIOVASCULAR: Normal heart rate and rhythm, S1 and S2 normal without murmurs. ABDOMEN: Soft, non-tender, non-distended, without organomegaly, normal bowel sounds. EXTREMITIES: No cyanosis or edema. NEUROLOGICAL: Cranial nerves grossly intact, moves all extremities without gross motor or sensory deficit.   ASSESSMENT AND PLAN: .    Assessment and Plan Assessment & Plan Type 1 diabetes mellitus with associated hyperlipidemia and elevated cardiovascular risk due to family history  Type 1 diabetes with hyperlipidemia and elevated cardiovascular risk due to family history. Recent cholesterol panel shows improvement with HDL at 68, LDL at 76, and triglycerides at 53. LDL goal is under 70. Family history includes father's coronary disease and stents in his 80s. No coronary disease in brothers but elevated cholesterol. Discussed the importance of cholesterol management in type 1 diabetes to reduce cardiovascular risk. Consideration of calcium scoring CT to assess coronary calcium and further stratify risk. Discussed the potential for a calcium score to guide the decision on statin therapy and the possibility of using Repatha if statins are not tolerated. - Order CT coronary calcium score to assess coronary calcium. - Discuss results with endocrinologist to determine further management. - Consider starting pravastatin 10 mg, with option to try  half dose or every other day dosing, and monitor for tolerance.This was originally prescribed by her endocrinologist, rasonable to consider for further risk reduction.   Statin intolerance, myalgias Previous intolerance to  statins with symptoms resembling flu-like illness. Endocrinologist prescribed pravastatin, which has not been tried yet. Discussed potential for myalgias and the importance of attempting statin therapy due to diabetes-related cardiovascular risk. Consideration of alternative lipid-lowering strategies if statins are not tolerated, based on calcium score results. - Try pravastatin 10 mg, with option to try half dose every other day, and monitor for tolerance. - If intolerant, consider alternative lipid-lowering strategies based on calcium score results.       Soyla Merck, MD, FACC

## 2024-04-11 ENCOUNTER — Encounter: Payer: Self-pay | Admitting: *Deleted

## 2024-04-15 ENCOUNTER — Ambulatory Visit: Admitting: Internal Medicine
